# Patient Record
Sex: Female | Born: 1955 | Race: White | Hispanic: No | Marital: Single | Smoking: Current every day smoker
Health system: Southern US, Community
[De-identification: ages and names within clinical notes are randomized; demographics above are authoritative.]

## PROBLEM LIST (undated history)

## (undated) DIAGNOSIS — M1990 Unspecified osteoarthritis, unspecified site: Secondary | ICD-10-CM

## (undated) HISTORY — PX: NECK SURGERY: SHX720

## (undated) HISTORY — DX: Unspecified osteoarthritis, unspecified site: M19.90

## (undated) HISTORY — PX: ABDOMINAL HYSTERECTOMY: SHX81

---

## 1997-08-04 ENCOUNTER — Emergency Department (HOSPITAL_COMMUNITY): Admission: EM | Admit: 1997-08-04 | Discharge: 1997-08-04 | Payer: Self-pay | Admitting: Emergency Medicine

## 1999-03-12 ENCOUNTER — Other Ambulatory Visit: Admission: RE | Admit: 1999-03-12 | Discharge: 1999-03-12 | Payer: Self-pay | Admitting: Internal Medicine

## 1999-08-05 ENCOUNTER — Encounter: Admission: RE | Admit: 1999-08-05 | Discharge: 1999-08-05 | Payer: Self-pay | Admitting: Internal Medicine

## 1999-08-05 ENCOUNTER — Encounter: Payer: Self-pay | Admitting: Internal Medicine

## 2001-10-18 ENCOUNTER — Other Ambulatory Visit: Admission: RE | Admit: 2001-10-18 | Discharge: 2001-10-18 | Payer: Self-pay | Admitting: Internal Medicine

## 2002-02-11 ENCOUNTER — Observation Stay (HOSPITAL_COMMUNITY): Admission: RE | Admit: 2002-02-11 | Discharge: 2002-02-12 | Payer: Self-pay | Admitting: Obstetrics and Gynecology

## 2002-02-11 ENCOUNTER — Encounter (INDEPENDENT_AMBULATORY_CARE_PROVIDER_SITE_OTHER): Payer: Self-pay | Admitting: *Deleted

## 2002-10-14 ENCOUNTER — Other Ambulatory Visit: Admission: RE | Admit: 2002-10-14 | Discharge: 2002-10-14 | Payer: Self-pay | Admitting: Obstetrics and Gynecology

## 2002-11-26 ENCOUNTER — Encounter: Admission: RE | Admit: 2002-11-26 | Discharge: 2002-11-26 | Payer: Self-pay | Admitting: Internal Medicine

## 2003-01-26 ENCOUNTER — Emergency Department (HOSPITAL_COMMUNITY): Admission: AD | Admit: 2003-01-26 | Discharge: 2003-01-26 | Payer: Self-pay | Admitting: Family Medicine

## 2003-01-29 ENCOUNTER — Encounter: Admission: RE | Admit: 2003-01-29 | Discharge: 2003-01-29 | Payer: Self-pay | Admitting: Internal Medicine

## 2003-02-07 ENCOUNTER — Ambulatory Visit (HOSPITAL_COMMUNITY): Admission: RE | Admit: 2003-02-07 | Discharge: 2003-02-08 | Payer: Self-pay | Admitting: Neurosurgery

## 2004-10-28 ENCOUNTER — Other Ambulatory Visit: Admission: RE | Admit: 2004-10-28 | Discharge: 2004-10-28 | Payer: Self-pay | Admitting: Internal Medicine

## 2005-10-31 ENCOUNTER — Other Ambulatory Visit: Admission: RE | Admit: 2005-10-31 | Discharge: 2005-10-31 | Payer: Self-pay | Admitting: Internal Medicine

## 2006-11-07 ENCOUNTER — Other Ambulatory Visit: Admission: RE | Admit: 2006-11-07 | Discharge: 2006-11-07 | Payer: Self-pay | Admitting: Internal Medicine

## 2007-11-08 ENCOUNTER — Ambulatory Visit: Payer: Self-pay | Admitting: Internal Medicine

## 2007-12-05 ENCOUNTER — Encounter: Admission: RE | Admit: 2007-12-05 | Discharge: 2007-12-05 | Payer: Self-pay | Admitting: Internal Medicine

## 2008-05-13 ENCOUNTER — Ambulatory Visit: Payer: Self-pay | Admitting: Internal Medicine

## 2008-09-11 ENCOUNTER — Ambulatory Visit: Payer: Self-pay | Admitting: Internal Medicine

## 2009-02-26 ENCOUNTER — Ambulatory Visit: Payer: Self-pay | Admitting: Internal Medicine

## 2009-02-26 ENCOUNTER — Other Ambulatory Visit: Admission: RE | Admit: 2009-02-26 | Discharge: 2009-02-26 | Payer: Self-pay | Admitting: Internal Medicine

## 2009-06-19 ENCOUNTER — Ambulatory Visit: Payer: Self-pay | Admitting: Internal Medicine

## 2010-02-13 ENCOUNTER — Encounter: Payer: Self-pay | Admitting: Internal Medicine

## 2010-02-14 ENCOUNTER — Encounter: Payer: Self-pay | Admitting: Internal Medicine

## 2010-02-15 ENCOUNTER — Ambulatory Visit
Admission: RE | Admit: 2010-02-15 | Discharge: 2010-02-15 | Payer: Self-pay | Source: Home / Self Care | Attending: Internal Medicine | Admitting: Internal Medicine

## 2010-05-27 ENCOUNTER — Other Ambulatory Visit (HOSPITAL_COMMUNITY)
Admission: RE | Admit: 2010-05-27 | Discharge: 2010-05-27 | Disposition: A | Discharge status: Home / Self Care | Payer: BC Managed Care – PPO | Source: Ambulatory Visit | Attending: Family Medicine | Admitting: Family Medicine

## 2010-05-27 ENCOUNTER — Other Ambulatory Visit: Payer: Self-pay | Admitting: Family Medicine

## 2010-05-27 DIAGNOSIS — Z124 Encounter for screening for malignant neoplasm of cervix: Secondary | ICD-10-CM | POA: Insufficient documentation

## 2010-05-31 ENCOUNTER — Other Ambulatory Visit: Payer: Self-pay | Admitting: Family Medicine

## 2010-05-31 DIAGNOSIS — Z1231 Encounter for screening mammogram for malignant neoplasm of breast: Secondary | ICD-10-CM

## 2010-05-31 DIAGNOSIS — Z780 Asymptomatic menopausal state: Secondary | ICD-10-CM

## 2010-06-11 NOTE — Op Note (Signed)
NAME:  April Leonard, April Leonard                           ACCOUNT NO.:  [id number]   MEDICAL RECORD NO.:  [id number]                   PATIENT TYPE:  OIB   LOCATION:  3010                                 FACILITY:  [facility]   PHYSICIAN:  Hilda Lias, M.D.                DATE OF BIRTH:  [birth year]   DATE OF PROCEDURE:  02/07/2003  DATE OF DISCHARGE:                                 OPERATIVE REPORT   PREOPERATIVE DIAGNOSIS:  C6-C7 herniated disk, central and to the right.   POSTOPERATIVE DIAGNOSIS:  C6-C7 herniated disk, central and to the right.   PROCEDURE:  Anterior C6-C7 diskectomy with removal of free fragment in the  right side.  Interbody fusion with allograft plate, microscope.   SURGEON:  Hilda Lias, M.D.   ASSISTANT:  Danae Orleans. Venetia Maxon, M.D..   INDICATIONS:  The patient was seen by me yesterday in my office because of  neck and right upper extremity pain.  This pain had been going on for 2  weeks, and she had by MRI a large herniated disk at the level of C6-C7  central and to the right side.  Clinically, she has 1/5 weakness of the  biceps.  The patient wanted to proceed with surgery because of the pain and  weakness.  The patient has been unable to walk.   DESCRIPTION OF PROCEDURE:  The patient was taken to the OR and after  intubation, the left side of the neck was prepped with Betadine.  Transverse  incision was made through the skin, platysma, and down to the cervical  spine.  X-ray showed that indeed we were at the level of C6-C7.  Then, with  the microscope, we opened the anterior ligament.  Total gross diskectomy was  done.  There was an opening of the posterior ligament.  Incision was done  and 3 fragments were removed from the right side.  Then, having good  decompression of both C7 nerve root and the spinal cord, the endplates were  trimmed.  This was followed by a plate of 7 mm height with a plate and four  screws.  __________ C-spine show good position of bone graft.   After this,  the area was irrigated.  Hemostasis was done with bipolar.  The area was  absolutely dry and from then on we closed it with Vicryl and Steri-Strips.                                               Hilda Lias, M.D.    EB/MEDQ  D:  02/07/2003  T:  02/08/2003  Job:  [id number]   cc:   Luanna Cole. Lenord Fellers, M.D.  [address] [place]  [place]  [place] [address]  Fax: [phone]

## 2010-06-11 NOTE — Op Note (Signed)
NAME:  April Leonard, April Leonard                           ACCOUNT NO.:  [id number]   MEDICAL RECORD NO.:  [id number]                   PATIENT TYPE:  AMB   LOCATION:  SDC                                  [facility]   PHYSICIAN:  Juluis Mire, M.D.                DATE OF BIRTH:  August 31, 1955   DATE OF PROCEDURE:  02/11/2002  DATE OF DISCHARGE:                                 OPERATIVE REPORT   PREOPERATIVE DIAGNOSES:  Endometriosis.   POSTOPERATIVE DIAGNOSES:  Endometriosis with pathology pending.   OPERATIVE PROCEDURE:  Laparoscopically assisted vaginal hysterectomy.   SURGEON:  Juluis Mire, M.D.   ASSISTANT:  Duke Salvia. Marcelle Overlie, M.D.   ANESTHESIA:  General endotracheal.   ESTIMATED BLOOD LOSS:  300 cubic centimeters.   PACKS AND DRAINS:  None.   INTRAOPERATIVE BLOOD PLACED:  None.   COMPLICATIONS:  None.   INDICATIONS:  Dictated in history and physical.   PROCEDURE AS FOLLOWS:  The patient taken to the OR and placed in supine  position.  After a satisfactory level of general endotracheal anesthesia was  obtained patient was placed in the dorsal lithotomy position using Allen  stirrups.  The abdomen, perineum, and vagina were prepped out with Betadine.  Bladder was emptied with in-and-out catheterization.  Examination under  anesthesia revealed the uterus to be of normal size, shape, contour.  Adnexa  unremarkable.  Speculum was placed in the vaginal vault.  The cervix grasped  with single tooth tenaculum.  The Hulka tenaculum was put in place and  secured.  The single tooth tenaculum and speculum were then removed.  The  patient was then draped out for surgery.  A subumbilical incision made with  the knife.  The incision was then extended through subcutaneous tissue.  The  fascia was identified and entered sharply.  Rectus muscles were separated in  midline.  Peritoneum was entered.  Three lateral sutures of 0 Vicryl were  put in place in the fascia and held.  The open  trocar was put in place and  secured.  Laparoscope was introduced.  There was no evidence of injury to  adjacent organs.  A 5 mm trocar was put in place in the suprapubic area  under direct visualization.  We had good hemostasis and no evidence of  injury to adjacent organs.  Visualization revealed uterus to be upper limits  of normal size.  There was a corpus luteum cyst on the right ovary.  Left  ovary was unremarkable.  Previous bilateral tubal ligation was noted.  Several implants of deep endometriosis noted in the cul-de-sac area.  No  adhesions were noted.  Appendix was visualized, noted to be normal.  Upper  abdomen including liver and tip of the gallbladder were clear.  Using the  plasma kinetic gyrus tripolar we separated both round ligaments, utero-  ovarian pedicles, as well as the tubes and  mesosalpinx from the sides of the  uterus.  We had good hemostasis bilaterally.   At this point in time the abdomen was deinflated of its carbon dioxide.  The  laparoscope was removed.  We proceed with the vaginal surgery.  A weighted  speculum was placed in the vaginal vault.  The cervix was grasped with a  Christella Hartigan' tenaculum after the Hulka tenaculum had been removed.  Cul-de-sac  was entered sharply.  Both uterosacral ligaments were clamped, cut, and  suture ligated with 0 Vicryl.  The reflection of the vaginal mucosa  anteriorly was incised using the Bovie.  Paracervical tissue was clamped,  cut, and suture ligated with 0 Vicryl.  The bladder was further dissected  superiorly.  Vesicouterine space was identified, entered sharply.  Retractor  was put in place.  Using the clamp, cut, and tie technique with suture  ligatures of 0 Vicryl the parametrium was serially separated from the sides  of the uterus.  The uterus was then flipped.  The remaining pedicles were  clamped, cut, and uterus passed off the operative field.  Held pedicles  secured with free ties of 0 Vicryl.  Uterosacral  plication stitch with 0  Vicryl was put in place and secured.  The vaginal mucosa was reapproximated  in midline in a vertical fashion with interrupted figure-of-eight of 0  Vicryl.  Foley was placed to straight drain retrieving an adequate amount of  clear urine.  Sponge on sponge stick was placed in the vaginal vault.  The  legs were repositioned.   At this point in time the abdomen was reinflated with carbon dioxide.  Visualization did reveal some oozing from the bladder base which was brought  under control using cautery.  Both areas of endometriosis were cauterized  using the Bovie.  We had good hemostasis.  All active endometriosis was  treated.  The abdomen was deflated of its carbon dioxide, all trocars  removed.  Subumbilical fascia closed with two figure-of-eight of 0 Vicryl.  Skin was closed with interrupted subcuticulars of 4-0 Vicryl.  The  suprapubic incision was closed with Steri-Strips.  The sponge on sponge  stick was removed from the vaginal vault.  The patient taken out of the  dorsal lithotomy position, once alert and extubated transferred to recovery  room in good condition.  Sponge, instrument, needle count reported as  correct by circulating nurse x2.  Urine output remained clear and adequate.                                               Juluis Mire, M.D.    JSM/MEDQ  D:  02/11/2002  T:  02/11/2002  Job:  [id number]

## 2010-06-11 NOTE — H&P (Signed)
NAME:  April Leonard, April Leonard                           ACCOUNT NO.:  [id number]   MEDICAL RECORD NO.:  [id number]                   PATIENT TYPE:  OIB   LOCATION:  2899                                 FACILITY:  [facility]   PHYSICIAN:  Hilda Lias, M.D.                DATE OF BIRTH:  1955-06-22   DATE OF ADMISSION:  02/07/2003  DATE OF DISCHARGE:                                HISTORY & PHYSICAL   April Leonard is a lady who was seen by me in my office yesterday.  She came  crying because of neck and right upper extremity pain which had been going  on since the beginning of the year.  The pain is getting worse, and despite  conservative treatment, she is not any better.  She denies any problem with  the left upper extremity.  Patient had an MRI since her last evaluation.   PAST MEDICAL HISTORY:  Hysterectomy.   She is not allergic to any medication.   SOCIAL HISTORY:  She smokes.  She does not drink.   FAMILY HISTORY:  Negative.   REVIEW OF SYSTEMS:  Positive for neck and right upper extremity pain.  NEURO:  Mental status normal.   PHYSICAL EXAMINATION:  HEENT:  Normal.  NECK:  She is able to flex by extension lateralization process pain  developed to the right shoulder.  LUNGS:  Clear.  HEART:  Heart sounds normal.  EXTREMITIES:  There were normal extremities.  Normal pulses.  NEURO:  Mental status normal.  __________  normal.  Strength is 5/5 except  in the right triceps which is 2/5.  She also has hypotonia of the right  pectoralis major.  Reflexes symmetric with absence in the right triceps.   The MRI showed that indeed she had a herniated disk.  She has a herniated  disk at the left C6-7 central to the right.   IMPRESSION:  A C6-7 herniated disk.   RECOMMENDATIONS:  Patient is to proceed with surgery because she can no  longer deal with the pain.  She has been unable to work.  The procedure will  be a one-level anterior cervical diskectomy followed by grafting of the  plate at  the lower of C6-C7.  The surgery was explained to her, including  the possibility of infection, CSF leak, failure of the bone graft and  failure of the plate and the need for further surgery, also damage to the  esophagus, trachea, and vertebral artery.                                                Hilda Lias, M.D.    EB/MEDQ  D:  02/07/2003  T:  02/07/2003  Job:  [id number]

## 2010-06-11 NOTE — Discharge Summary (Signed)
   NAME:  April Leonard, April Leonard                           ACCOUNT NO.:  [id number]   MEDICAL RECORD NO.:  [id number]                   PATIENT TYPE:  OBV   LOCATION:  9326                                 [facility]ILITY[facility]   PHYSICIAN:  Juluis Mire, M.D.                DATE OF BIRTH:  1955/08/27   DATE OF ADMISSION:  02/11/2002  DATE OF DISCHARGE:  02/12/2002                                 DISCHARGE SUMMARY   ADMITTING DIAGNOSES:  Pelvic endometriosis.   DISCHARGE DIAGNOSES:  Pelvic endometriosis with pathology pending.   OPERATIVE PROCEDURE:  Laparoscopically assisted vaginal hysterectomy.   HISTORY OF PRESENT ILLNESS:  For complete history and physical, please see  dictated note.   HOSPITAL COURSE:  The patient underwent laparoscopically assisted vaginal  hysterectomy.  There were signs of active endometriosis that was treated  with cautery.  Postoperatively patient did well.  Postoperative hemoglobin  was 11.6.  Discharged home on that day.  At that time she was tolerating a  diet, ambulating without difficulty.  She was afebrile with stable vital  signs.  Subumbilical and suprapubic incisions were intact.  The patient had  active bowel sounds.  No active vaginal bleeding.   In terms of complications, none were encountered during stay in hospital.  The patient discharged home in stable condition.   DISPOSITION:  Routine postoperative instructions were given.  She is to  avoid heavy lifting, vaginal entrance, or driving a car.  She is to watch  for signs of infection, nausea, vomiting, increasing abdominal pain, or  active vaginal bleeding.  Discharged home on Tylox as needed for pain.  Plan  to follow up in the office in one week.                                               Juluis Mire, M.D.    JSM/MEDQ  D:  02/12/2002  T:  02/12/2002  Job:  [id number]

## 2010-06-11 NOTE — H&P (Signed)
NAME:  April, Leonard NO.:  [id number]   MEDICAL RECORD NO.:  [id number]                   PATIENT TYPE:   LOCATION:                                       FACILITY:   PHYSICIAN:  Juluis Mire, M.D.                DATE OF BIRTH:   DATE OF ADMISSION:  DATE OF DISCHARGE:                                HISTORY & PHYSICAL   HISTORY OF PRESENT ILLNESS:  A 55 year old gravida 2, para 2 married white  female presents for a laparoscopic assisted vaginal hysterectomy.   In relation to the present admission, the patient has had a past history of  endometriosis.  She had been having increasing problems with abnormal  uterine bleeding as well as significant pelvic pain and discomfort.  The  pain has become extremely limited and has been unresponsive to attempts at  over-the-counter agents.  The patient has had previous laparoscopic  evaluation at the time of her tubal and treatment of endometriosis.  Because  of tobacco use she has been unable to use birth control pills as a  management option.  Due to increasing symptomatology, the patient now  presents for management via laparoscopically assisted vaginal hysterectomy.   ALLERGIES:  The patient has no known drug allergies.   MEDICATIONS:  None.   PAST MEDICAL HISTORY:  Usual childhood disease without any significant  sequelae.   OBSTETRICAL HISTORY:  She has had two spontaneous vaginal deliveries.   PAST SURGICAL HISTORY:  She has had previous laparoscopic bilateral tubal  ligation.   FAMILY HISTORY:  Noncontributory.   SOCIAL HISTORY:  Tobacco use.  No alcohol use.   REVIEW OF SYSTEMS:  Noncontributory.   PHYSICAL EXAMINATION:  VITAL SIGNS:  The patient is afebrile with stale  vital signs.  HEENT:  The patient is normocephalic.  Pupils are equal, round, and reactive  to light and accommodation.  Extraocular movements are intact.  Sclerae and  conjunctivae clear.  Oropharynx clear.  NECK:   Without thyromegaly.  BREASTS:  No discreet masses.  LUNGS:  Clear.  CARDIOVASCULAR:  Regular rhythm and rate without murmurs or gallops.  ABDOMEN:  Benign.  No mass, organomegaly, or tenderness.  PELVIC:  Normal external genitalia.  Vaginal mucosa clear.  Cervix  unremarkable.  Uterus feels to be of normal size, shape, and contour.  Adnexa free of masses or tenderness.  EXTREMITIES:  Trace edema.  NEUROLOGIC:  Grossly within normal limits.   IMPRESSION:  Symptomatic pelvic endometriosis.   PLAN:  Again, options have been discussed for management including attempt  at hormonal management with Depo Provera versus outpatient surgery.  The  patient is desires definitive therapy.  The risks of surgery have been  discussed including the risk of infection, the risk of hemorrhage, the risk  of injury to adjacent organs that could require further exploratory surgery,  the risk of deep venous thrombosis  and pulmonary embolus.  The patient  expressed understanding of indications and risks.                                               Juluis Mire, M.D.    JSM/MEDQ  D:  02/11/2002  T:  02/11/2002  Job:  [id number]

## 2010-06-23 ENCOUNTER — Ambulatory Visit: Payer: Self-pay

## 2010-06-24 ENCOUNTER — Ambulatory Visit: Payer: Self-pay

## 2010-06-24 ENCOUNTER — Other Ambulatory Visit: Payer: Self-pay

## 2010-07-12 ENCOUNTER — Ambulatory Visit
Admission: RE | Admit: 2010-07-12 | Discharge: 2010-07-12 | Disposition: A | Discharge status: Home / Self Care | Payer: BC Managed Care – PPO | Source: Ambulatory Visit | Attending: Family Medicine | Admitting: Family Medicine

## 2010-07-12 DIAGNOSIS — Z780 Asymptomatic menopausal state: Secondary | ICD-10-CM

## 2010-07-12 DIAGNOSIS — Z1231 Encounter for screening mammogram for malignant neoplasm of breast: Secondary | ICD-10-CM

## 2011-06-02 ENCOUNTER — Ambulatory Visit: Payer: BC Managed Care – PPO | Admitting: Family Medicine

## 2012-06-12 ENCOUNTER — Other Ambulatory Visit: Payer: Self-pay

## 2012-06-12 DIAGNOSIS — Z1231 Encounter for screening mammogram for malignant neoplasm of breast: Secondary | ICD-10-CM

## 2012-07-20 ENCOUNTER — Ambulatory Visit: Payer: BC Managed Care – PPO

## 2012-08-20 ENCOUNTER — Ambulatory Visit: Payer: BC Managed Care – PPO

## 2012-08-31 ENCOUNTER — Ambulatory Visit
Admission: RE | Admit: 2012-08-31 | Discharge: 2012-08-31 | Disposition: A | Discharge status: Home / Self Care | Payer: BC Managed Care – PPO | Source: Ambulatory Visit

## 2012-08-31 DIAGNOSIS — Z1231 Encounter for screening mammogram for malignant neoplasm of breast: Secondary | ICD-10-CM

## 2014-05-02 ENCOUNTER — Other Ambulatory Visit: Payer: Self-pay | Admitting: Physician Assistant

## 2014-05-02 DIAGNOSIS — M8580 Other specified disorders of bone density and structure, unspecified site: Secondary | ICD-10-CM

## 2018-11-28 ENCOUNTER — Other Ambulatory Visit: Payer: Self-pay | Admitting: Obstetrics and Gynecology

## 2018-11-28 DIAGNOSIS — Z1231 Encounter for screening mammogram for malignant neoplasm of breast: Secondary | ICD-10-CM

## 2018-11-28 DIAGNOSIS — R5381 Other malaise: Secondary | ICD-10-CM

## 2018-12-25 ENCOUNTER — Other Ambulatory Visit: Payer: Self-pay | Admitting: Obstetrics and Gynecology

## 2018-12-25 DIAGNOSIS — E2839 Other primary ovarian failure: Secondary | ICD-10-CM

## 2019-03-27 ENCOUNTER — Other Ambulatory Visit: Payer: Self-pay

## 2019-03-27 ENCOUNTER — Ambulatory Visit: Payer: Self-pay

## 2019-08-01 ENCOUNTER — Other Ambulatory Visit: Payer: Self-pay

## 2019-08-01 ENCOUNTER — Ambulatory Visit
Admission: RE | Admit: 2019-08-01 | Discharge: 2019-08-01 | Disposition: A | Discharge status: Home / Self Care | Payer: BC Managed Care – PPO | Source: Ambulatory Visit | Attending: Obstetrics and Gynecology | Admitting: Obstetrics and Gynecology

## 2019-08-01 DIAGNOSIS — E2839 Other primary ovarian failure: Secondary | ICD-10-CM

## 2019-08-01 DIAGNOSIS — Z1231 Encounter for screening mammogram for malignant neoplasm of breast: Secondary | ICD-10-CM

## 2019-10-25 ENCOUNTER — Ambulatory Visit
Admission: RE | Admit: 2019-10-25 | Discharge: 2019-10-25 | Disposition: A | Discharge status: Home / Self Care | Payer: BC Managed Care – PPO | Source: Ambulatory Visit | Attending: Obstetrics and Gynecology | Admitting: Obstetrics and Gynecology

## 2019-10-25 ENCOUNTER — Other Ambulatory Visit: Payer: Self-pay | Admitting: Obstetrics and Gynecology

## 2019-10-25 ENCOUNTER — Ambulatory Visit: Payer: BC Managed Care – PPO | Admitting: Nurse Practitioner

## 2019-10-25 DIAGNOSIS — F419 Anxiety disorder, unspecified: Secondary | ICD-10-CM

## 2019-10-25 DIAGNOSIS — S139XXA Sprain of joints and ligaments of unspecified parts of neck, initial encounter: Secondary | ICD-10-CM

## 2019-10-25 DIAGNOSIS — J449 Chronic obstructive pulmonary disease, unspecified: Secondary | ICD-10-CM

## 2019-10-25 DIAGNOSIS — M810 Age-related osteoporosis without current pathological fracture: Secondary | ICD-10-CM

## 2019-10-25 DIAGNOSIS — M545 Low back pain, unspecified: Secondary | ICD-10-CM

## 2020-06-26 ENCOUNTER — Ambulatory Visit: Payer: BC Managed Care – PPO | Admitting: Family

## 2020-06-30 ENCOUNTER — Other Ambulatory Visit: Payer: Self-pay | Admitting: Obstetrics and Gynecology

## 2020-06-30 DIAGNOSIS — Z1231 Encounter for screening mammogram for malignant neoplasm of breast: Secondary | ICD-10-CM

## 2020-08-05 ENCOUNTER — Ambulatory Visit: Payer: BC Managed Care – PPO | Admitting: Internal Medicine

## 2020-08-27 ENCOUNTER — Ambulatory Visit: Payer: BC Managed Care – PPO

## 2020-09-20 DIAGNOSIS — B370 Candidal stomatitis: Secondary | ICD-10-CM | POA: Diagnosis not present

## 2020-09-20 DIAGNOSIS — J020 Streptococcal pharyngitis: Secondary | ICD-10-CM | POA: Diagnosis not present

## 2020-11-11 ENCOUNTER — Ambulatory Visit: Payer: BC Managed Care – PPO | Admitting: Internal Medicine

## 2020-11-26 ENCOUNTER — Ambulatory Visit
Admission: RE | Admit: 2020-11-26 | Discharge: 2020-11-26 | Disposition: A | Discharge status: Home / Self Care | Payer: BC Managed Care – PPO | Source: Ambulatory Visit | Attending: Family | Admitting: Family

## 2020-11-26 ENCOUNTER — Other Ambulatory Visit: Payer: Self-pay

## 2020-11-26 ENCOUNTER — Other Ambulatory Visit: Payer: Self-pay | Admitting: Family

## 2020-11-26 DIAGNOSIS — M546 Pain in thoracic spine: Secondary | ICD-10-CM

## 2021-01-22 DIAGNOSIS — J324 Chronic pansinusitis: Secondary | ICD-10-CM | POA: Diagnosis not present

## 2021-01-22 DIAGNOSIS — J029 Acute pharyngitis, unspecified: Secondary | ICD-10-CM | POA: Diagnosis not present

## 2021-01-22 DIAGNOSIS — R051 Acute cough: Secondary | ICD-10-CM | POA: Diagnosis not present

## 2021-11-19 IMAGING — CR DG CERVICAL SPINE 2 OR 3 VIEWS
4 series · 4 of 4 positions shown · non-contrast
Comparison: None.

CLINICAL DATA: Posterior neck pain for 1 week. Remote history of
cervical fusion.

EXAM:
CERVICAL SPINE - 2-3 VIEW

[w cervical spine lat]
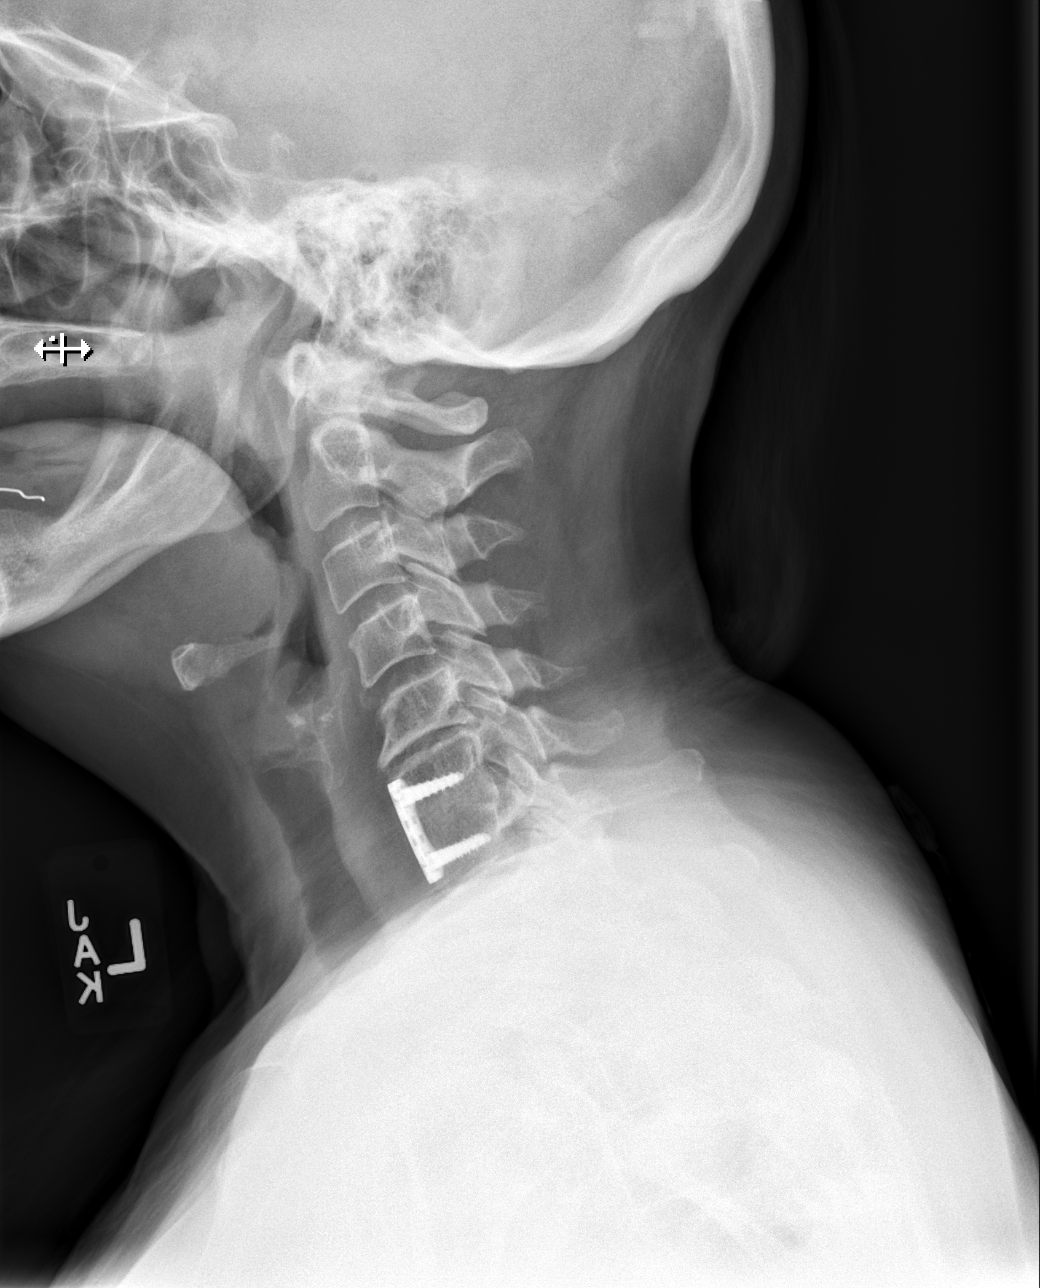

[w cervical spine ap]
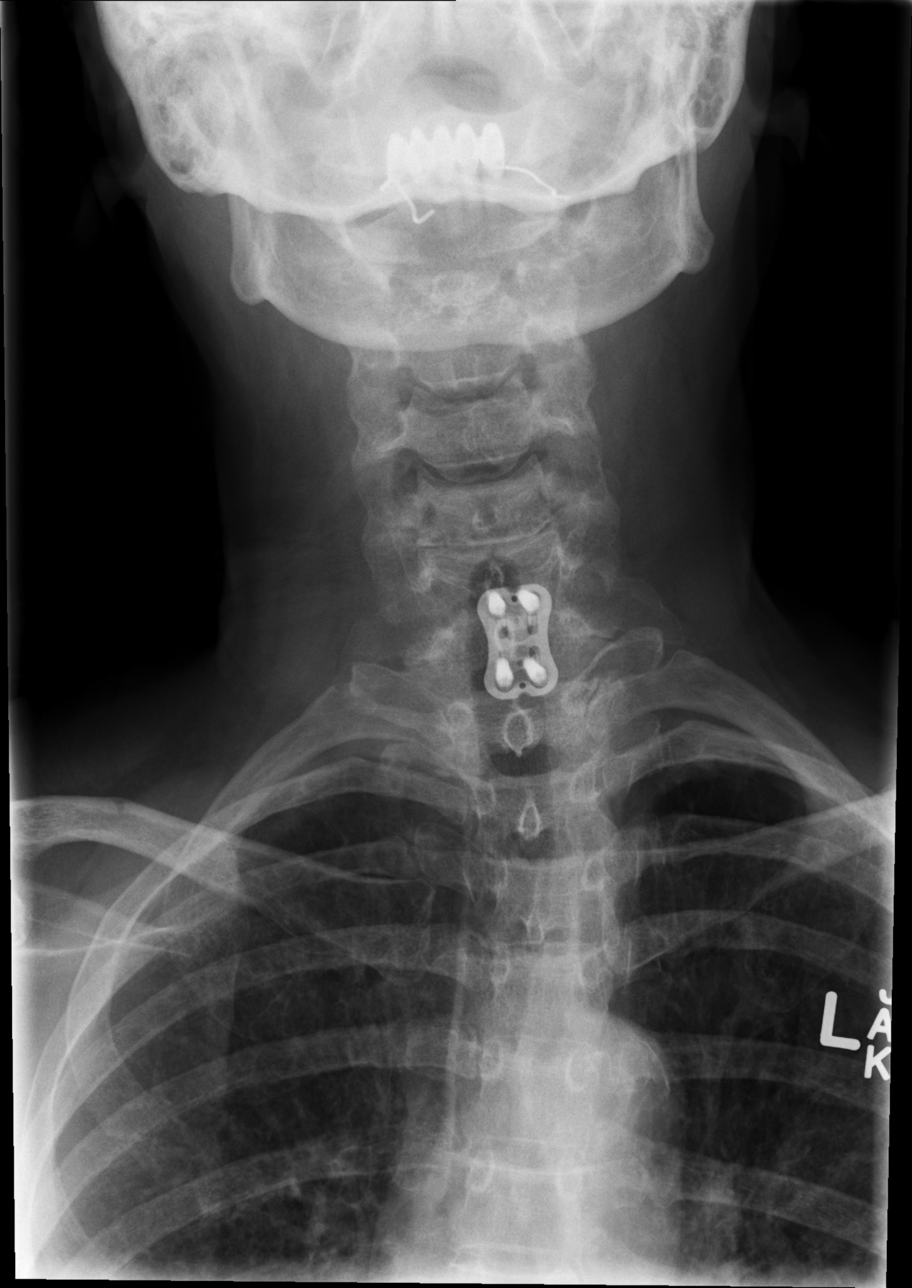

[w cervical spine odontoid (1 of 2)]
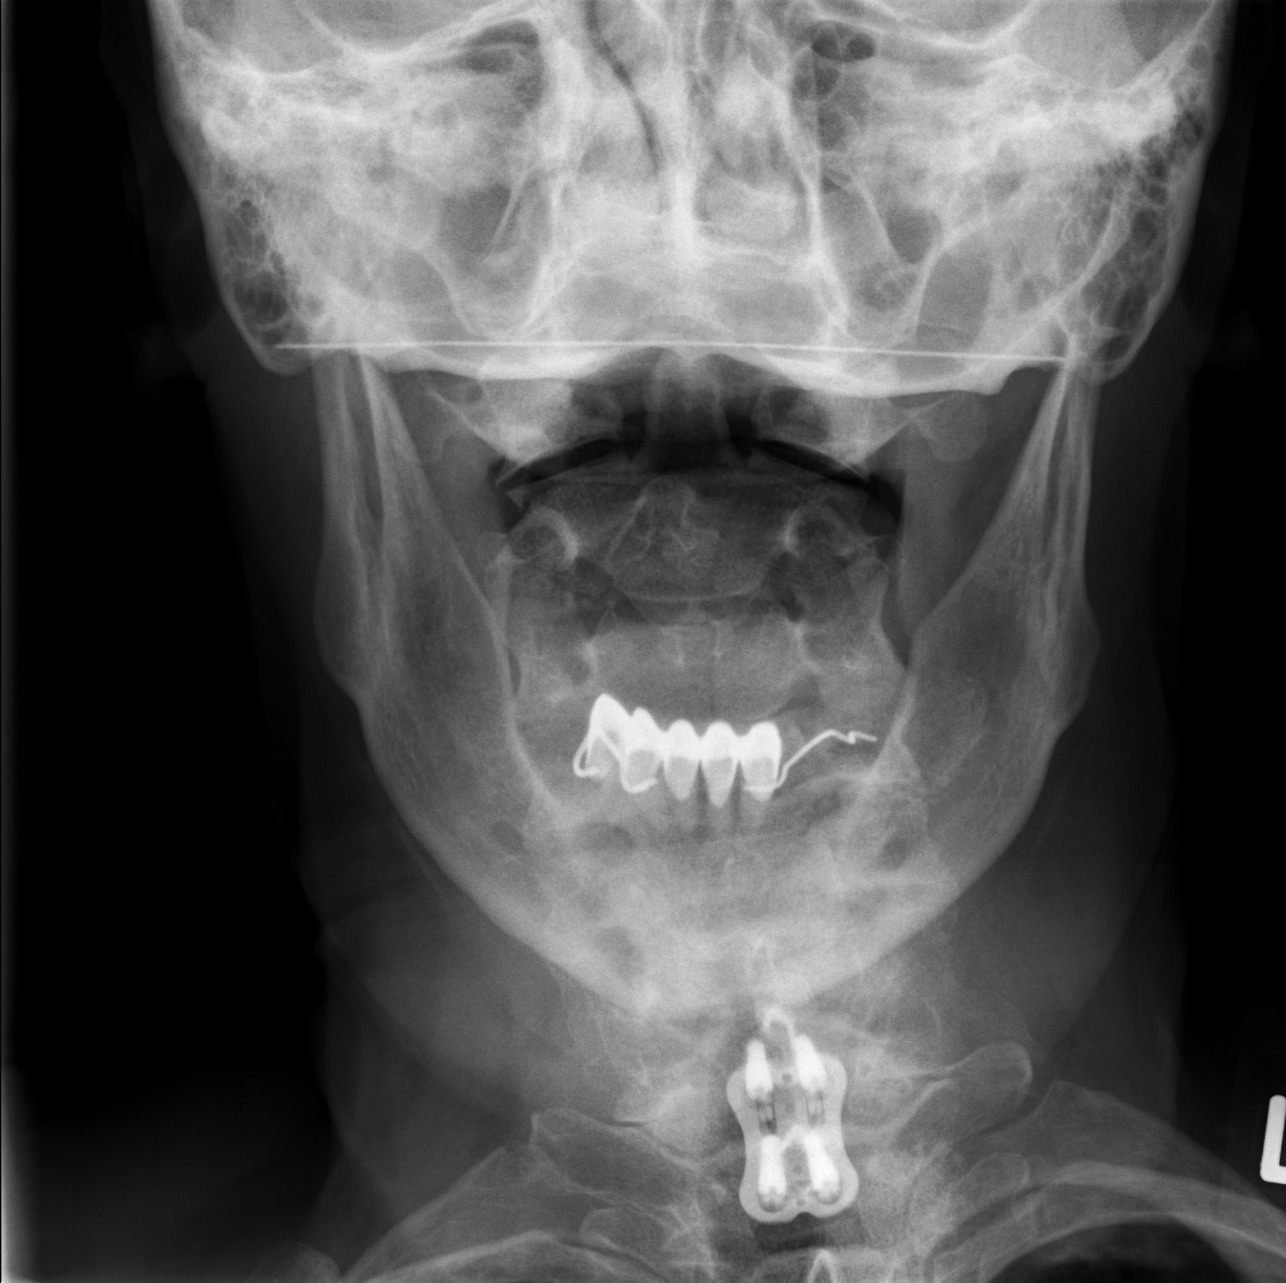

[w cervical spine odontoid (2 of 2)]
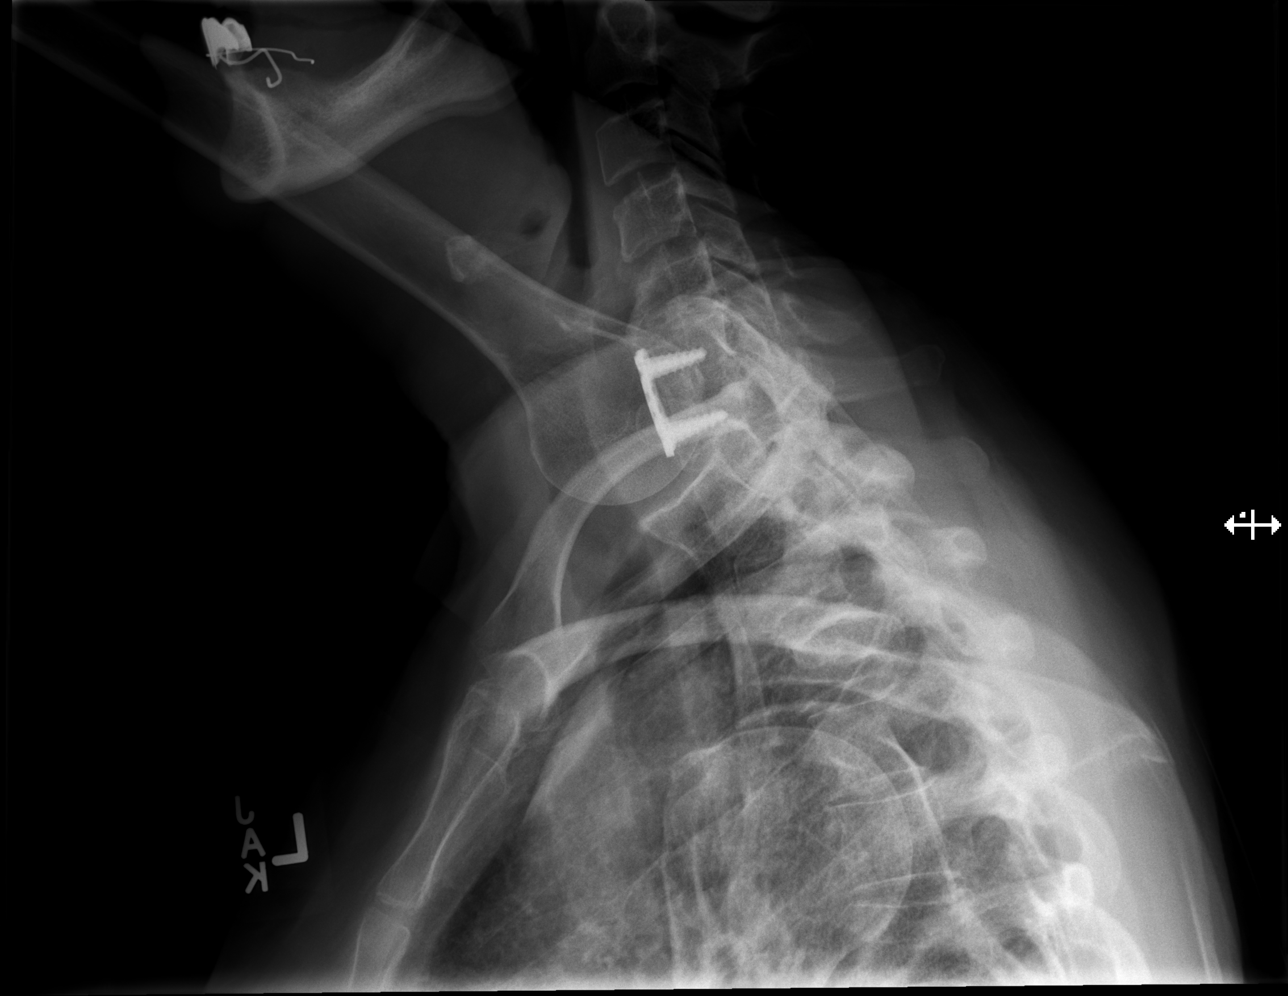

[4 of 4 positions shown; findings below may reference images not displayed]

FINDINGS: There is no fracture or malalignment. Straightening of lordosis is
noted. The patient is status post solid C6-7 fusion. Hardware is
intact. Loss of disc space height and endplate spurring are seen at
C5-6. Prevertebral soft tissues are normal. Lung apices are clear.
IMPRESSION: No acute abnormality.

C5-6 degenerative disc disease.

Status post solid C6-7 fusion.

## 2022-03-23 ENCOUNTER — Encounter: Payer: Self-pay | Admitting: Family Medicine

## 2022-03-23 ENCOUNTER — Ambulatory Visit: Payer: BC Managed Care – PPO | Admitting: Family Medicine

## 2022-03-23 ENCOUNTER — Telehealth: Payer: Self-pay | Admitting: Family Medicine

## 2022-03-23 VITALS — BP 136/80 | HR 92 | Temp 98.5°F | Ht 65.0 in | Wt 150.8 lb

## 2022-03-23 DIAGNOSIS — F419 Anxiety disorder, unspecified: Secondary | ICD-10-CM | POA: Diagnosis not present

## 2022-03-23 DIAGNOSIS — Z0000 Encounter for general adult medical examination without abnormal findings: Secondary | ICD-10-CM

## 2022-03-23 DIAGNOSIS — E7800 Pure hypercholesterolemia, unspecified: Secondary | ICD-10-CM

## 2022-03-23 DIAGNOSIS — Z720 Tobacco use: Secondary | ICD-10-CM

## 2022-03-23 DIAGNOSIS — M542 Cervicalgia: Secondary | ICD-10-CM

## 2022-03-23 DIAGNOSIS — Z5181 Encounter for therapeutic drug level monitoring: Secondary | ICD-10-CM

## 2022-03-23 DIAGNOSIS — J449 Chronic obstructive pulmonary disease, unspecified: Secondary | ICD-10-CM | POA: Diagnosis not present

## 2022-03-23 MED ORDER — HYDROCODONE-ACETAMINOPHEN 5-325 MG PO TABS: ORAL_TABLET | ORAL | 0 refills | Status: DC

## 2022-03-23 MED ORDER — HYDROCODONE-ACETAMINOPHEN 5-325 MG PO TABS: ORAL_TABLET | ORAL | 0 refills | Status: AC

## 2022-03-23 MED ORDER — ESCITALOPRAM OXALATE 5 MG PO TABS: 5.0000 mg | ORAL_TABLET | Freq: Every day | ORAL | 1 refills | Status: AC

## 2022-03-23 NOTE — Telephone Encounter (Signed)
FYI message below.

## 2022-03-23 NOTE — Progress Notes (Signed)
New Patient Office Visit  Subjective    Patient ID: April Leonard, female    DOB: 07/30/1955  Age: 67 y.o. MRN: [id number]  CC:  Chief Complaint  Patient presents with   Establish Care    NP/establish care, no concerns. Patient not fasting.     HPI April Leonard presents to establish care.  She openly notes that she is a difficult patient.  She presents specifically for a refill on her pain medicines.  She tells that she has chronic pain involving her neck thoracic and lumbar spines.  She is status post cervical fusion in 2005.  She takes ibuprofen 800 mg when needed.  She uses hydrocodone 5/25 for severe pain.  She assures me that she takes no more than 12 of these every 6 months.  She suffers from anxiety and takes Ambien at night for this.  She has a history of palpitations at night.  She tells me that she is seeing the cardiologist for for these and had a negative workup.  She has smoked for 50 years.  She is not interested in quitting.  She has shortness of breath with exertion.  She is status post hysterectomy for endometriosis years ago.  She stopped going for mammograms because her daughter contracted breast cancer and she did not like the way that her daughter had been treated.  She does not believe in vaccines and refuses all of them.  She has a history of elevated cholesterol and has not tolerated atorvastatin or Crestor she tells me.  She has been seeing the nurse practitioner at work for refills on her hydrocodone and Ambien. Encounter Diagnoses  Name Primary?   Neck pain Yes   Tobacco use    Chronic obstructive pulmonary disease, unspecified COPD type ([place])    Anxiety    Elevated cholesterol    Medication monitoring encounter    Healthcare maintenance      Outpatient Encounter Medications as of 03/23/2022  Medication Sig   albuterol (VENTOLIN HFA) 108 (90 Base) MCG/ACT inhaler SMARTSIG:2 Puff(s) By Mouth 4 Times Daily PRN   escitalopram (LEXAPRO) 5 MG tablet Take 1 tablet  (5 mg total) by mouth daily.   zolpidem (AMBIEN) 5 MG tablet Take 2.5 mg by mouth at bedtime as needed.   [DISCONTINUED] HYDROcodone-acetaminophen (NORCO) 5-325 MG tablet May take up to 1 daily for severe pain.  No more than 12 every 6 months.   HYDROcodone-acetaminophen (NORCO) 5-325 MG tablet May take up to 1 daily for severe pain.  No more than 12 every 6 months.   [DISCONTINUED] HYDROcodone-acetaminophen (NORCO) 5-325 MG tablet May take up to 1 daily for severe pain.  No more than 12 every 6 months.   No facility-administered encounter medications on file as of 03/23/2022.    Past Medical History:  Diagnosis Date   Arthritis     Past Surgical History:  Procedure Laterality Date   ABDOMINAL HYSTERECTOMY     NECK SURGERY      History reviewed. No pertinent family history.  Social History   Socioeconomic History   Marital status: Single    Spouse name: Not on file   Number of children: Not on file   Years of education: Not on file   Highest education level: Not on file  Occupational History   Not on file  Tobacco Use   Smoking status: Every Day    Types: Cigarettes   Smokeless tobacco: Never  Vaping Use   Vaping Use: Never  used  Substance and Sexual Activity   Alcohol use: Never   Drug use: Never   Sexual activity: Not Currently  Other Topics Concern   Not on file  Social History Narrative   Not on file   Social Determinants of Health   Financial Resource Strain: Not on file  Food Insecurity: Not on file  Transportation Needs: Not on file  Physical Activity: Not on file  Stress: Not on file  Social Connections: Not on file  Intimate Partner Violence: Not on file    ROS      Objective    BP 136/80 (BP Location: Right Arm, Patient Position: Sitting, Cuff Size: Normal)   Pulse 92   Temp 98.5 F (36.9 C) (Temporal)   Ht '5\' 5"'$  (1.651 m)   Wt 150 lb 12.8 oz (68.4 kg)   SpO2 93%   BMI 25.09 kg/m   Physical Exam      Assessment & Plan:   Neck  pain -     HYDROcodone-Acetaminophen; May take up to 1 daily for severe pain.  No more than 12 every 6 months.  Dispense: 12 tablet; Refill: 0  Tobacco use -     Ambulatory referral to Pulmonology  Chronic obstructive pulmonary disease, unspecified COPD type ([place]) -     Ambulatory referral to Pulmonology  Anxiety -     Escitalopram Oxalate; Take 1 tablet (5 mg total) by mouth daily.  Dispense: 30 tablet; Refill: 1 -     CBC; Future -     Urinalysis, Routine w reflex microscopic; Future  Elevated cholesterol -     Comprehensive metabolic panel; Future -     Lipid panel; Future  Medication monitoring encounter  Healthcare maintenance     Return in about 6 weeks (around 05/04/2022).  Will be happy to help this patient to the extent that she will allow.  Will try low-dose Lexapro for anxiety.  She did mention that she would be willing to take Xanax for her anxiety.  When I mention that these could not be mixed with her hydrocodone, she assured me that she would not be taking them together.  When I mention that I do not use the benzodiazepines.  She went on to tell me that her mother had been given Valium and died when it was taken away from her.  I suggested that it would be best not to start that class of medicine for a number of reasons.  I did mention that we would be switching her sleep medicine.  She will return fasting for above ordered blood work.  Information was given on the health maintenance and disease prevention as well as preventing high cholesterol and preventing disease through vaccinations.  Was also given information on smoking tobacco with its risks and potential harms.  Agrees to go see the pulmonologist.   Libby Maw, MD   2/29 addendum: Patient had left without her paperwork and a prescription for hydrocodone yesterday.  She called back today to say that she would not be coming back or returning for the paperwork or the prescription and that she was not going  to be taking the Lexapro.

## 2022-03-23 NOTE — Telephone Encounter (Signed)
FYI:Pt is not coming in to pick up the paper script for HYDROcodone-acetaminophen (NORCO) 5-325 MG tablet [id number]. She has also called the pharmacy and has cancelled getting her escitalopram (LEXAPRO) 5 MG tablet [id number] script.

## 2022-04-01 ENCOUNTER — Encounter: Payer: Self-pay | Admitting: Family Medicine

## 2022-04-01 ENCOUNTER — Telehealth: Payer: Self-pay | Admitting: Family Medicine

## 2022-04-01 NOTE — Telephone Encounter (Signed)
Per provider request, removed Dr. Ethelene Hal as PCP. Letter sent to pt via MyChart. Pt declined medical recommendation, left appointment 03/23/2022. Pt called and said she would not be returning for paperwork or prescription from provider.

## 2022-12-22 IMAGING — CR DG THORACIC SPINE 3V
3 series · 3 of 3 positions shown · non-contrast
Comparison: None.

CLINICAL DATA: Thoracic back pain. Patient reports pain between
shoulder blades for 2 months.

EXAM:
THORACIC SPINE - 3 VIEWS

[t thoracic spine ap]
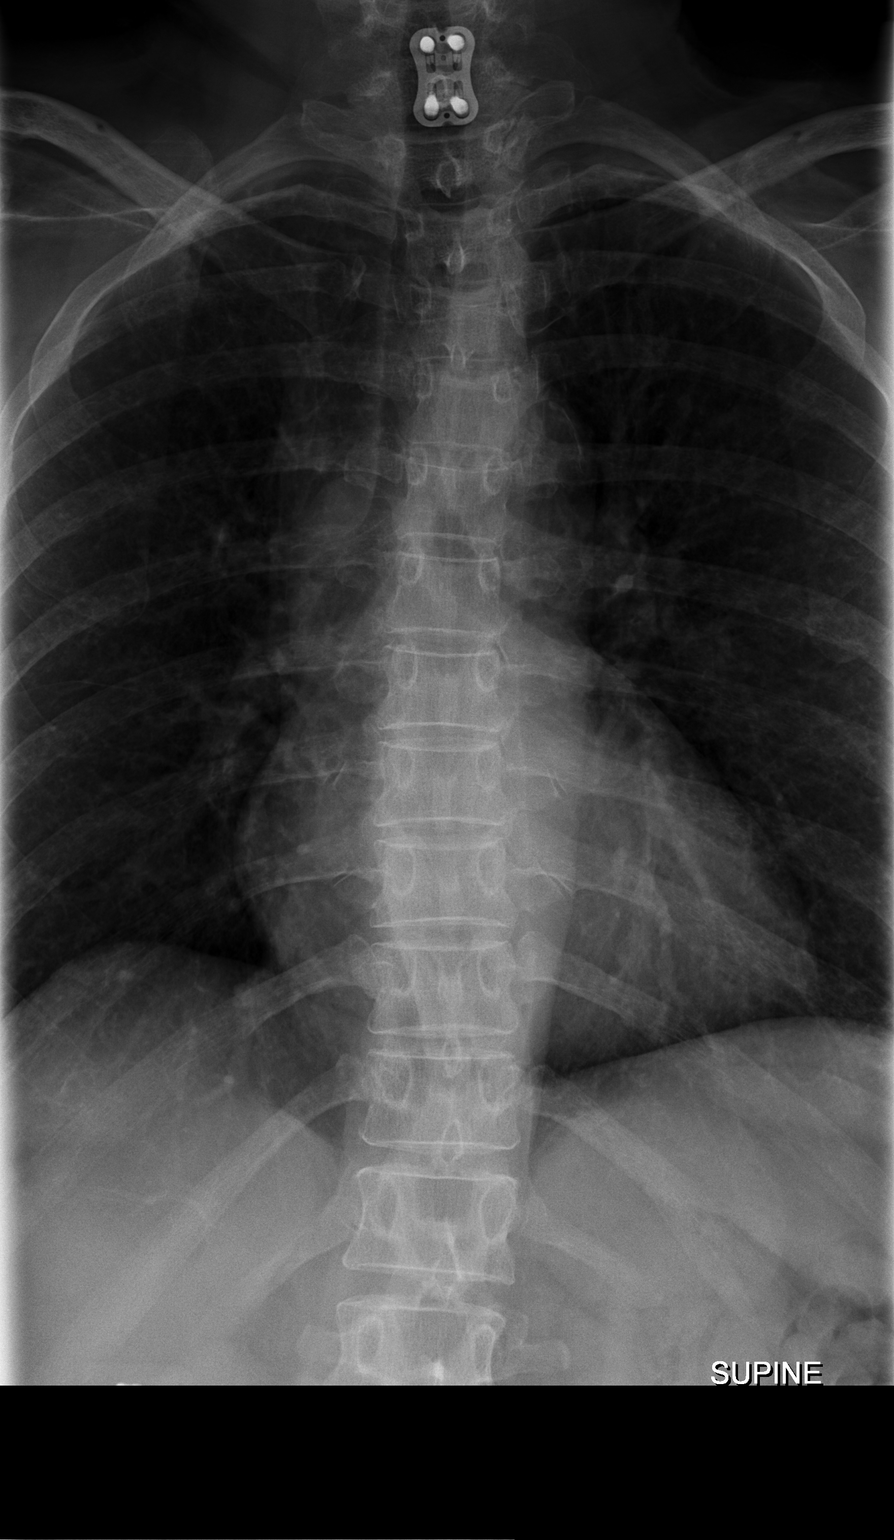

[t thoracic spine lat]
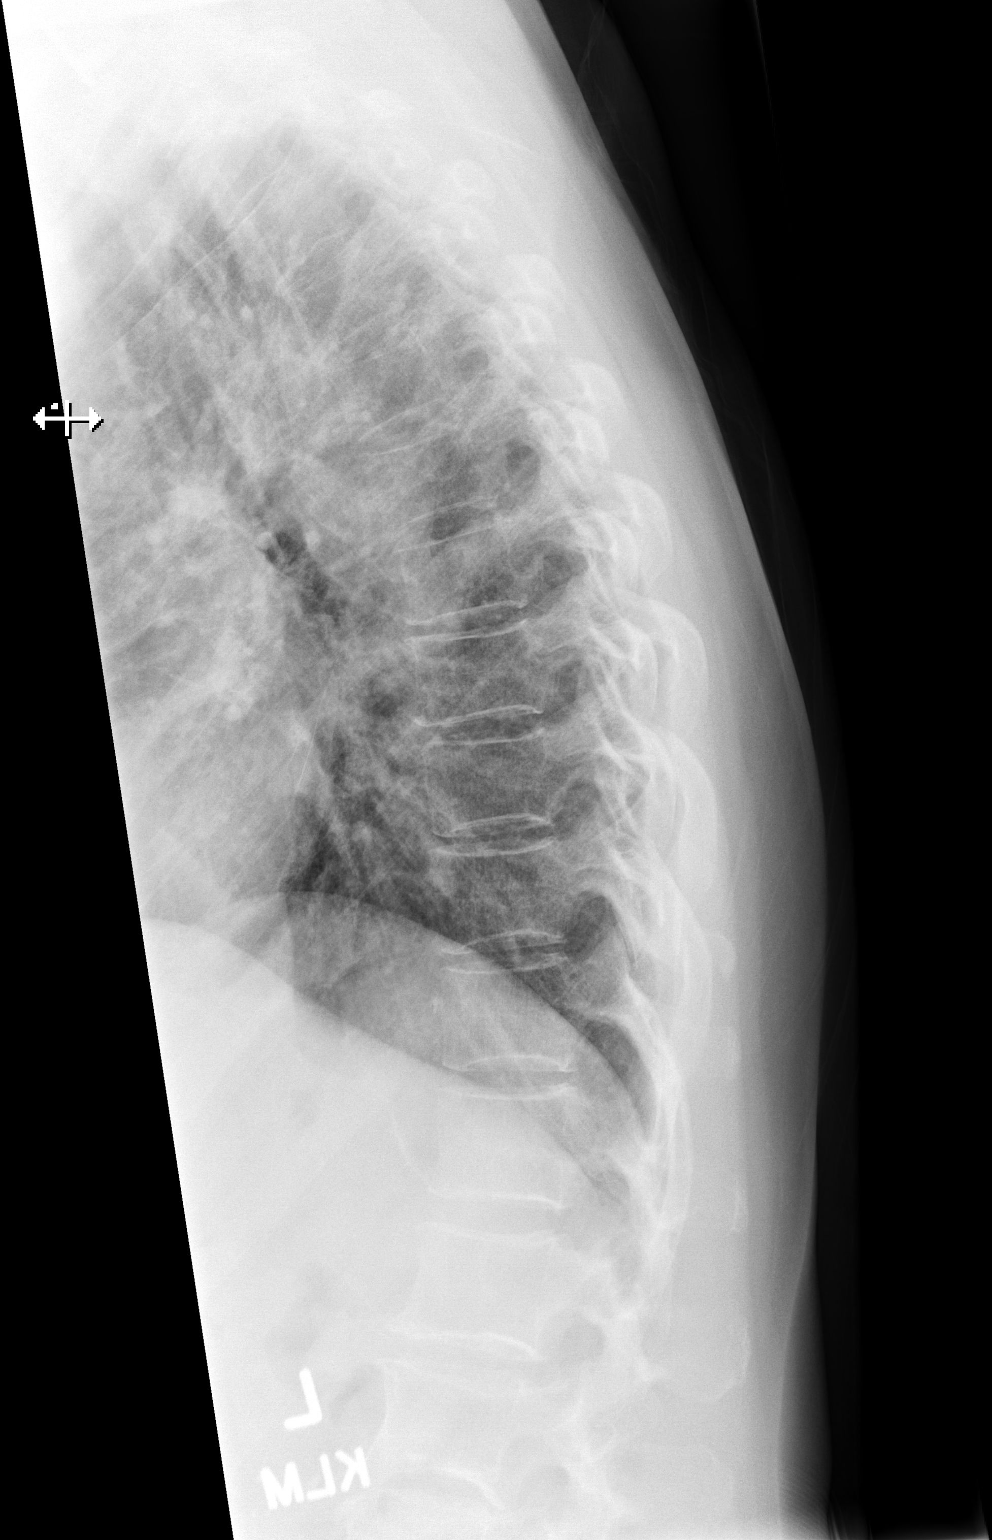

[t thoracic swimmers]
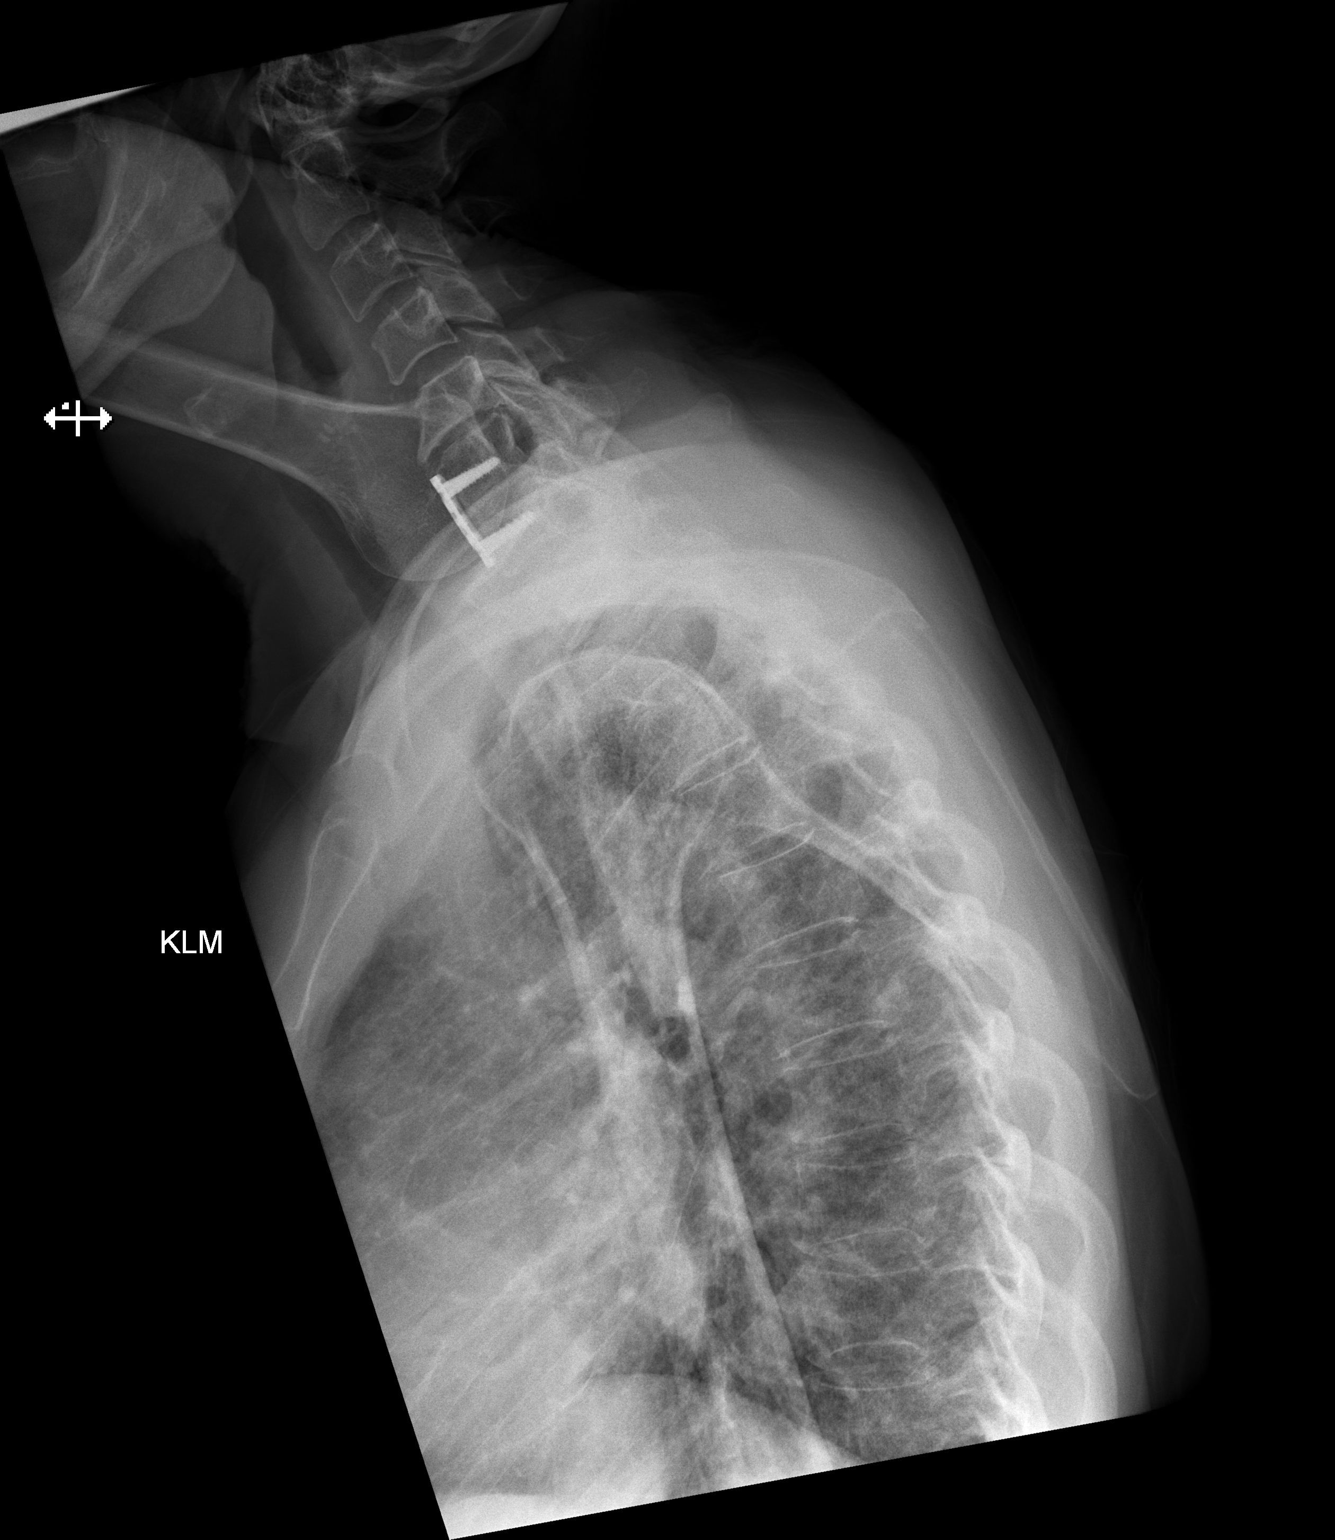

[3 of 3 positions shown; findings below may reference images not displayed]

FINDINGS: The alignment is maintained. Vertebral body heights are maintained.
No fracture or focal bone lesion. No significant disc space
narrowing. Posterior elements appear intact. There is no
paravertebral soft tissue abnormality. Surgical hardware in the
lower cervical spine is partially included.
IMPRESSION: Negative radiographs of the thoracic spine. No radiographic
explanation for back pain.

## 2023-01-23 DIAGNOSIS — R051 Acute cough: Secondary | ICD-10-CM | POA: Diagnosis not present

## 2023-01-23 DIAGNOSIS — R0981 Nasal congestion: Secondary | ICD-10-CM | POA: Diagnosis not present

## 2023-01-23 DIAGNOSIS — R070 Pain in throat: Secondary | ICD-10-CM | POA: Diagnosis not present

## 2023-01-23 DIAGNOSIS — J209 Acute bronchitis, unspecified: Secondary | ICD-10-CM | POA: Diagnosis not present

## 2023-09-29 DIAGNOSIS — M8588 Other specified disorders of bone density and structure, other site: Secondary | ICD-10-CM | POA: Diagnosis not present

## 2023-09-29 DIAGNOSIS — M810 Age-related osteoporosis without current pathological fracture: Secondary | ICD-10-CM | POA: Diagnosis not present

## 2024-01-19 DIAGNOSIS — R059 Cough, unspecified: Secondary | ICD-10-CM | POA: Diagnosis not present

## 2024-01-19 DIAGNOSIS — R070 Pain in throat: Secondary | ICD-10-CM | POA: Diagnosis not present
